# Patient Record
Sex: Female | Born: 1975 | Race: White | Hispanic: No | Marital: Married
Health system: Southern US, Community
[De-identification: ages and names within clinical notes are randomized; demographics above are authoritative.]

---

## 2013-08-13 ENCOUNTER — Other Ambulatory Visit: Payer: Self-pay | Admitting: Infectious Disease

## 2013-08-13 ENCOUNTER — Ambulatory Visit
Admission: RE | Admit: 2013-08-13 | Discharge: 2013-08-13 | Disposition: A | Discharge status: Home / Self Care | Payer: No Typology Code available for payment source | Source: Ambulatory Visit | Attending: Infectious Disease | Admitting: Infectious Disease

## 2013-08-13 DIAGNOSIS — A150 Tuberculosis of lung: Secondary | ICD-10-CM

## 2015-07-06 IMAGING — CR DG CHEST 1V
1 series · 1 of 1 positions shown · non-contrast
Comparison: None.

CLINICAL DATA: History of positive PPD test, assessment for
pulmonary tuberculosis

EXAM:
CHEST - 1 VIEW

[view not recorded]
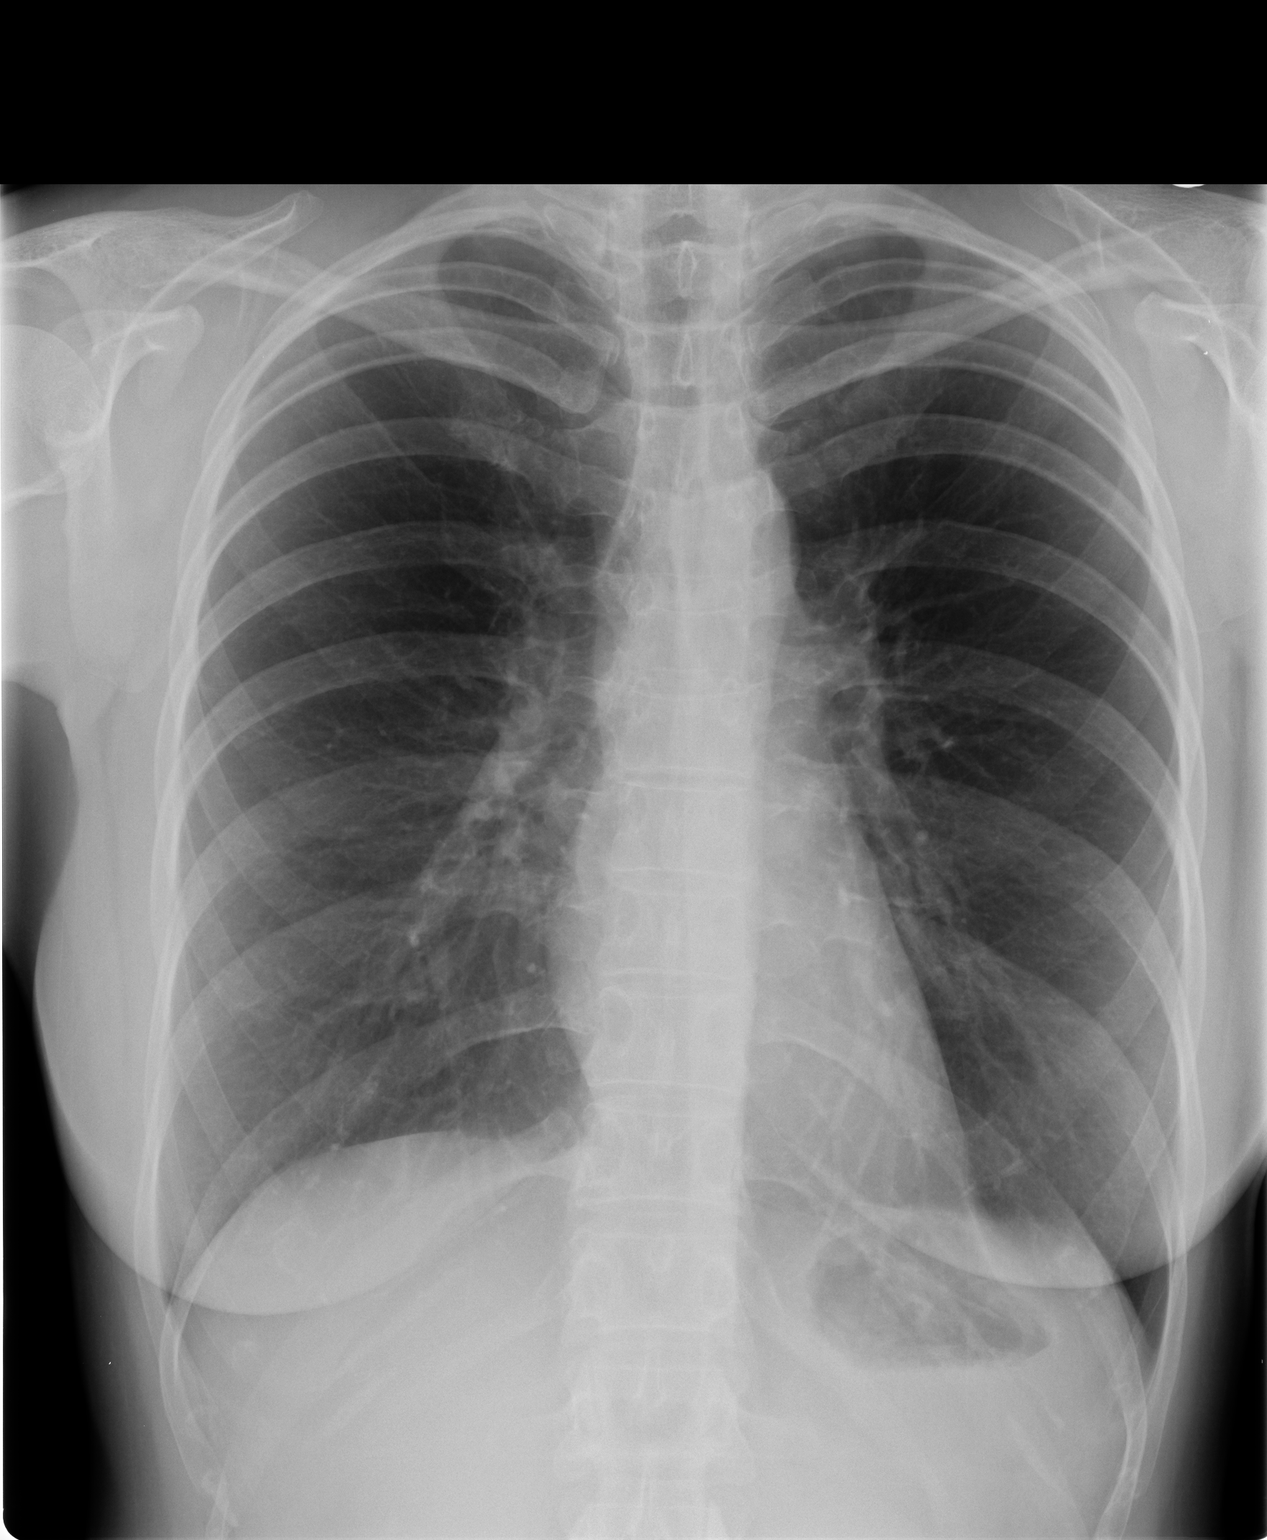

[1 of 1 positions shown; findings below may reference images not displayed]

FINDINGS: The heart size and mediastinal contours are within normal limits.
Both lungs are clear. The visualized skeletal structures are
unremarkable.
IMPRESSION: Negative.  No findings of active pulmonary tuberculosis.
